# Patient Record
Sex: Male | Born: 1965 | Race: White | Hispanic: No | Marital: Single
Health system: Southern US, Community
[De-identification: ages and names within clinical notes are randomized; demographics above are authoritative.]

---

## 2005-01-05 ENCOUNTER — Other Ambulatory Visit: Payer: Self-pay

## 2005-01-05 ENCOUNTER — Emergency Department: Payer: Self-pay | Admitting: Emergency Medicine

## 2009-07-07 ENCOUNTER — Emergency Department: Payer: Self-pay | Admitting: Emergency Medicine

## 2009-07-14 ENCOUNTER — Emergency Department (HOSPITAL_COMMUNITY): Admission: EM | Admit: 2009-07-14 | Discharge: 2009-07-14 | Payer: Self-pay | Admitting: Emergency Medicine

## 2009-09-03 ENCOUNTER — Emergency Department: Payer: Self-pay | Admitting: Internal Medicine

## 2010-07-01 ENCOUNTER — Emergency Department: Payer: Self-pay | Admitting: Emergency Medicine

## 2010-07-02 ENCOUNTER — Ambulatory Visit: Payer: Self-pay | Admitting: Unknown Physician Specialty

## 2010-07-03 ENCOUNTER — Ambulatory Visit: Payer: Self-pay | Admitting: Unknown Physician Specialty

## 2010-07-13 ENCOUNTER — Emergency Department: Payer: Self-pay | Admitting: Emergency Medicine

## 2010-07-16 LAB — DIFFERENTIAL
Basophils Absolute: 0.1 10*3/uL (ref 0.0–0.1)
Basophils Relative: 1 % (ref 0–1)
Monocytes Relative: 5 % (ref 3–12)
Neutro Abs: 5.8 10*3/uL (ref 1.7–7.7)

## 2010-07-16 LAB — COMPREHENSIVE METABOLIC PANEL
AST: 46 U/L — ABNORMAL HIGH (ref 0–37)
Albumin: 4.1 g/dL (ref 3.5–5.2)
BUN: 10 mg/dL (ref 6–23)
CO2: 27 mEq/L (ref 19–32)
Calcium: 8.9 mg/dL (ref 8.4–10.5)
Creatinine, Ser: 0.72 mg/dL (ref 0.4–1.5)
GFR calc non Af Amer: >60 mL/min (ref >60)
Glucose, Bld: 93 mg/dL (ref 70–99)
Sodium: 141 mEq/L (ref 135–145)
Total Protein: 6.2 g/dL (ref 6.0–8.3)

## 2010-07-16 LAB — CBC
HCT: 40.4 % (ref 39.0–52.0)
Hemoglobin: 13.8 g/dL (ref 13.0–17.0)
MCV: 103.8 fL — ABNORMAL HIGH (ref 78.0–100.0)
RBC: 3.89 MIL/uL — ABNORMAL LOW (ref 4.22–5.81)
RDW: 12.4 % (ref 11.5–15.5)

## 2010-10-02 ENCOUNTER — Ambulatory Visit: Payer: Self-pay | Admitting: Specialist

## 2010-10-16 ENCOUNTER — Other Ambulatory Visit: Payer: Self-pay | Admitting: Specialist

## 2010-12-11 ENCOUNTER — Ambulatory Visit: Payer: Self-pay | Admitting: Unknown Physician Specialty

## 2010-12-11 ENCOUNTER — Emergency Department: Payer: Self-pay | Admitting: Emergency Medicine

## 2010-12-11 ENCOUNTER — Emergency Department: Payer: Self-pay | Admitting: *Deleted

## 2010-12-22 ENCOUNTER — Ambulatory Visit: Payer: Self-pay | Admitting: Unknown Physician Specialty

## 2011-03-29 IMAGING — CR DG SHOULDER 3+V*L*
1 series · 3 of 3 positions shown · non-contrast
Comparison: none

REASON FOR EXAM: s/p fall, pain and swelling, pt in [place]
COMMENTS:

PROCEDURE:     DXR - DXR SHOULDER LEFT COMPLETE  - July 01, 2010  [DATE]
RESULT:     The patient has sustained a comminuted fracture of the neck of
the left humerus. The bony glenoid appears intact. The AC joint also appears
intact.

[Series 1: view not recorded · 0.17mm/px · 3 of 3 slices shown]
[im 1/3]
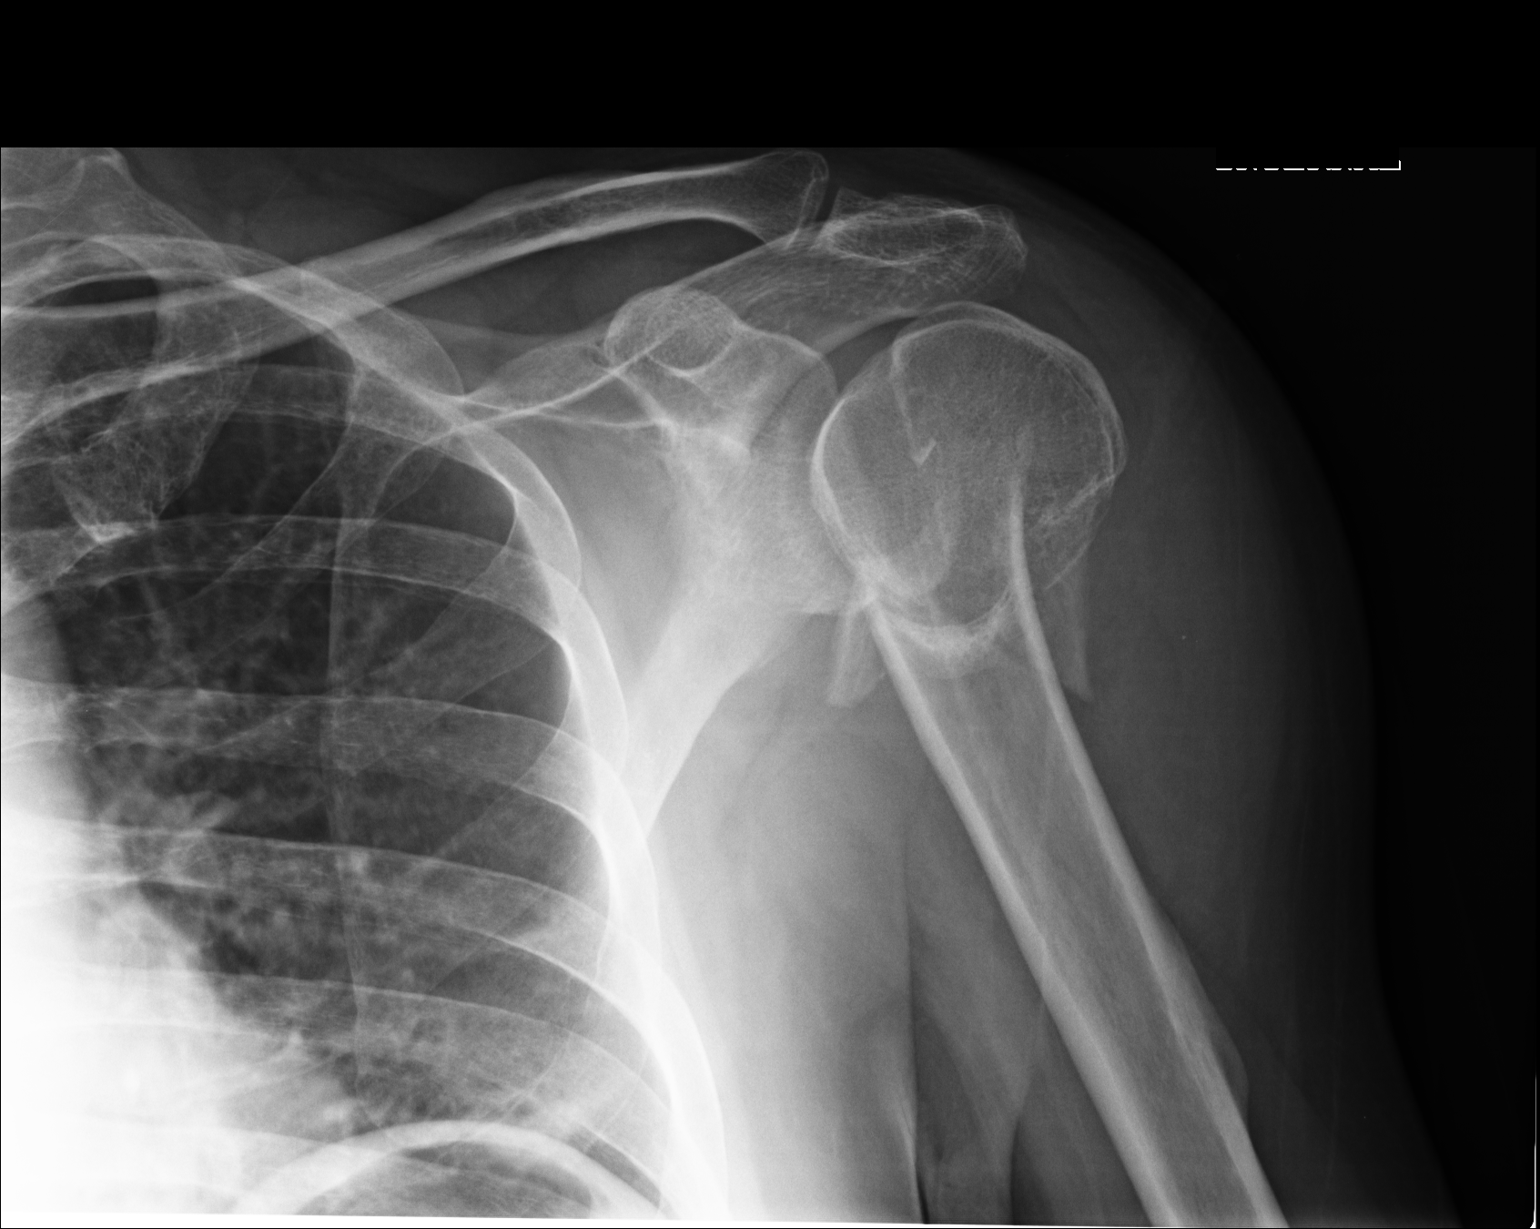
[im 2/3]
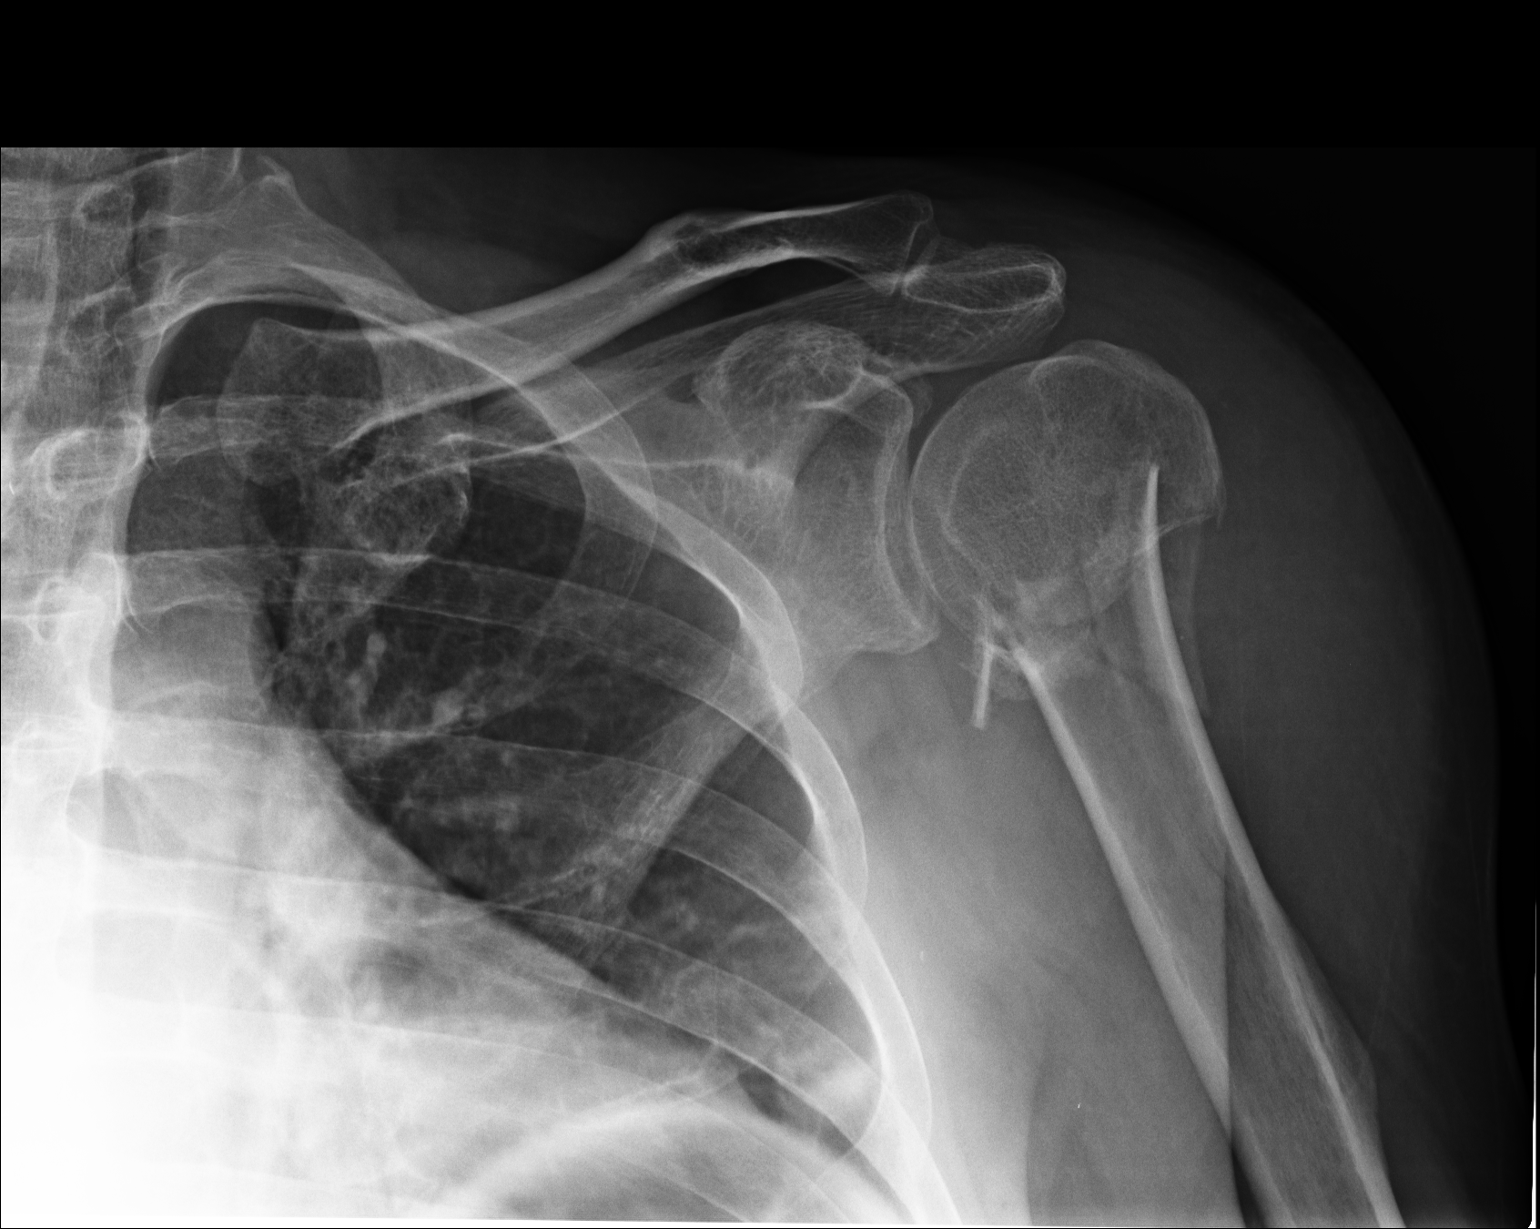
[im 3/3]
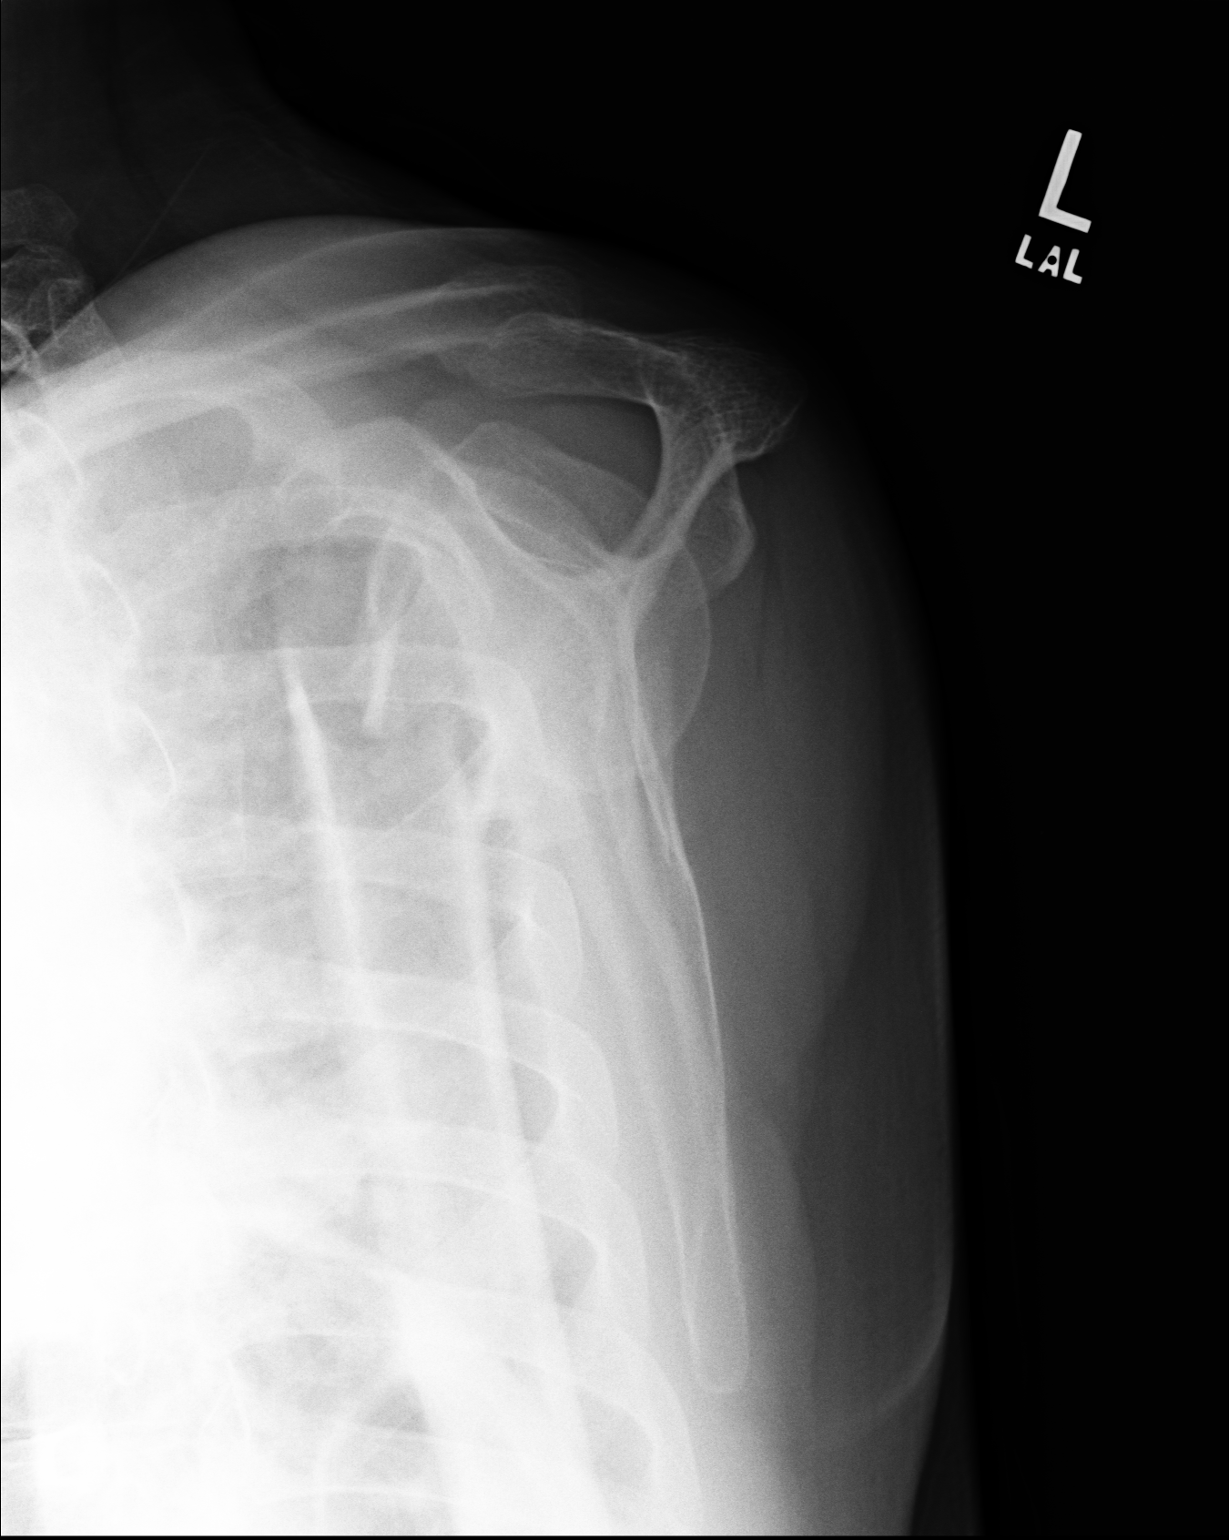

[3 of 3 positions shown; findings below may reference images not displayed]

IMPRESSION: The patient has sustained a comminuted impacted fracture of
the humeral neck on the left.

## 2011-06-30 IMAGING — US US ABDOMEN LIMITED SLG ORGAN/ASCITES
1 series · 17 of 25 positions shown · non-contrast
Comparison: none

REASON FOR EXAM: RUQ Pain Gallstones
COMMENTS:

[Series 1: us abdomen limited slg organ/ascites · 17 of 81 slices shown]
[im 1/81]
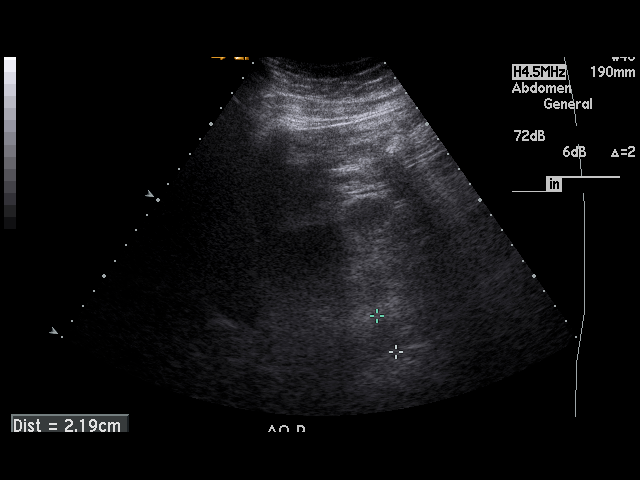
[im 7/81]
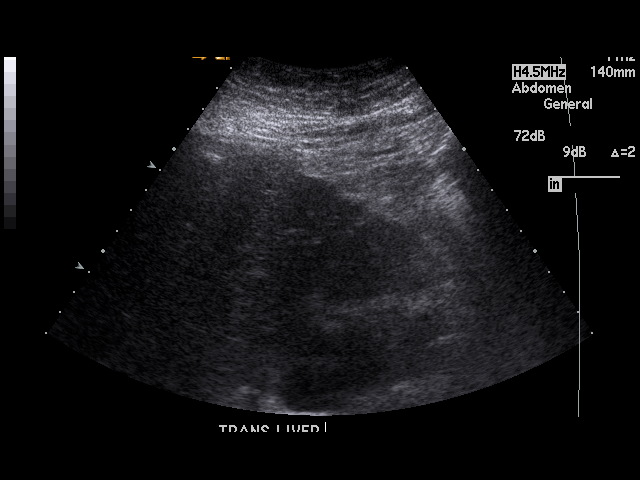
[im 11/81]
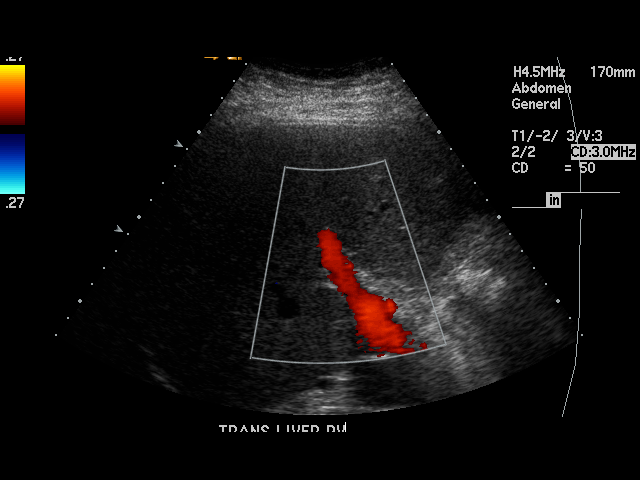
[im 17/81]
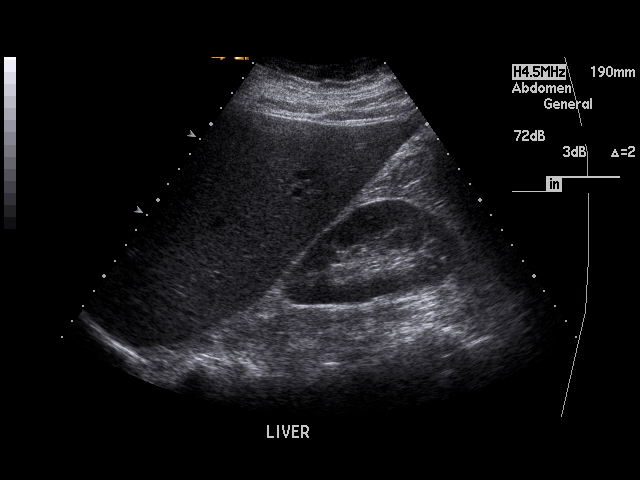
[im 21/81]
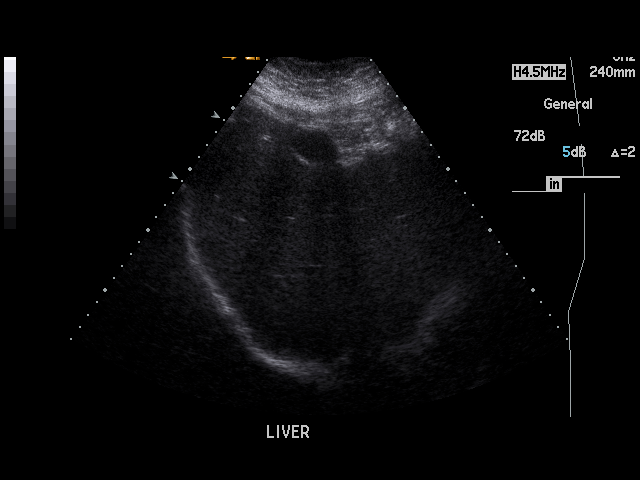
[im 27/81]
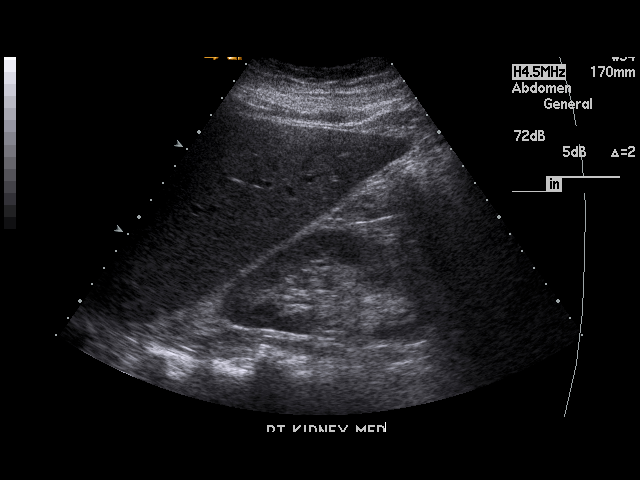
[im 31/81]
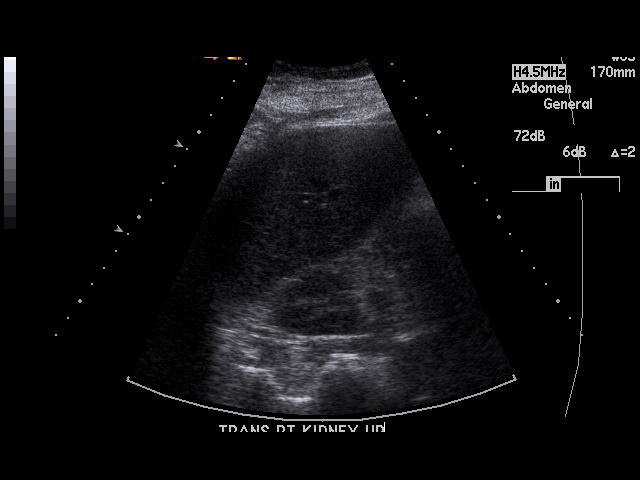
[im 37/81]
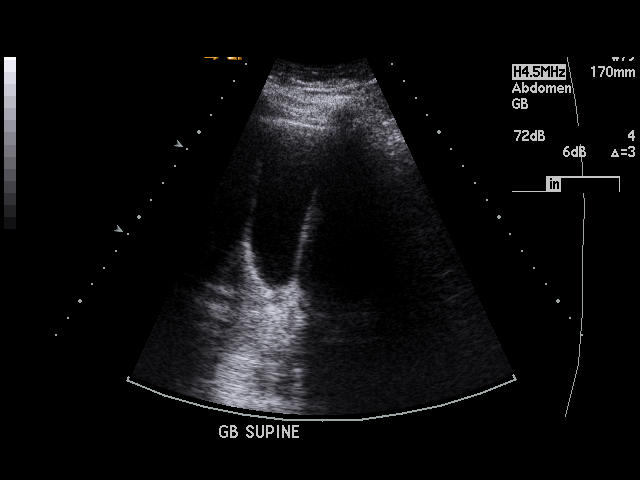
[im 41/81]
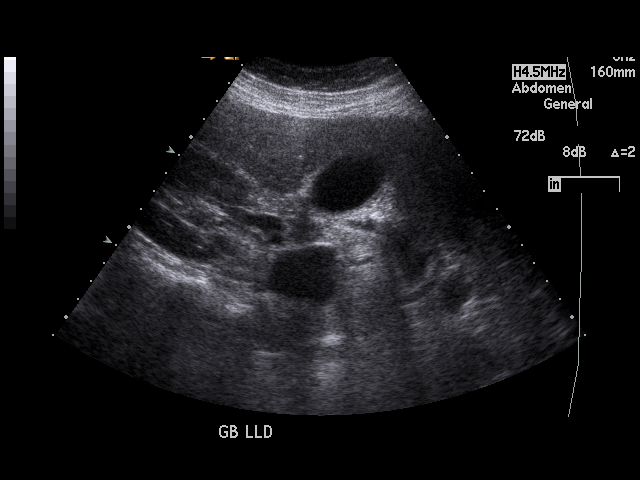
[im 44/81]
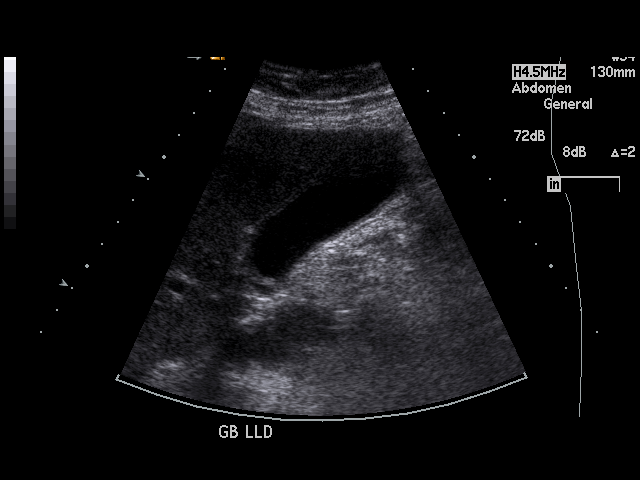
[im 51/81]
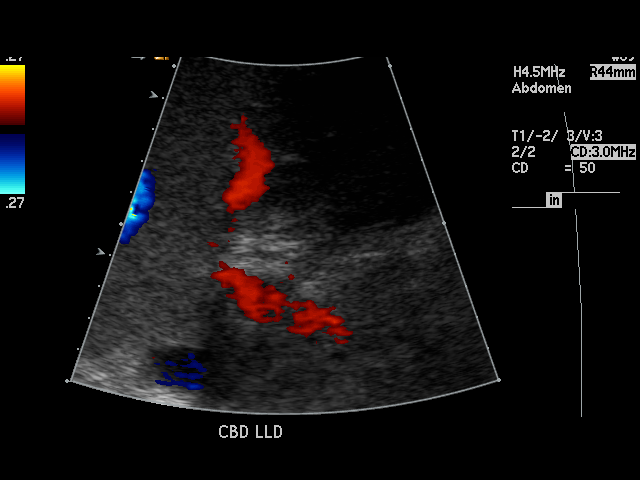
[im 54/81]
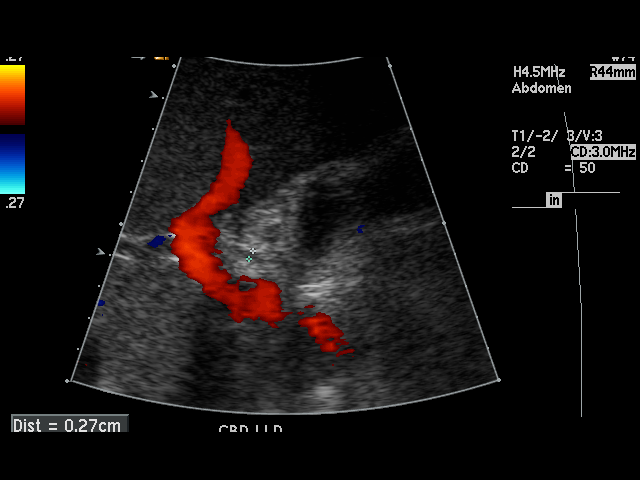
[im 61/81]
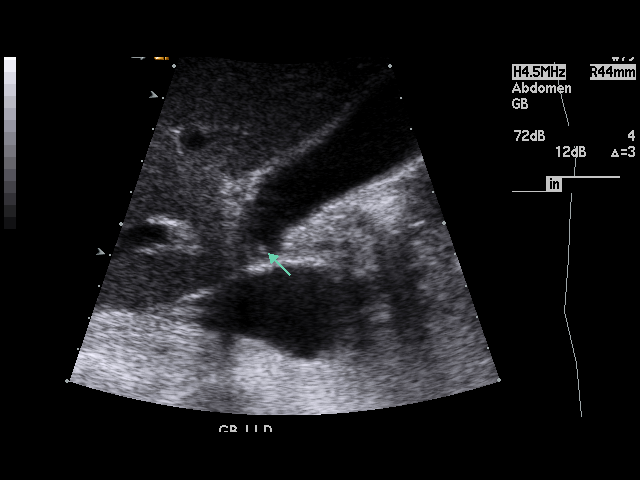
[im 64/81]
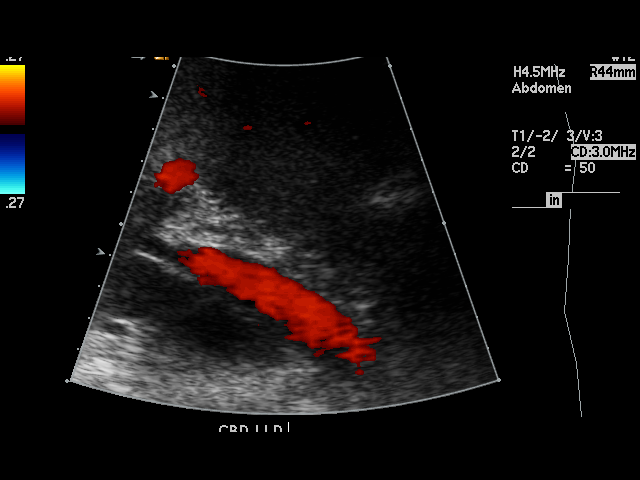
[im 71/81]
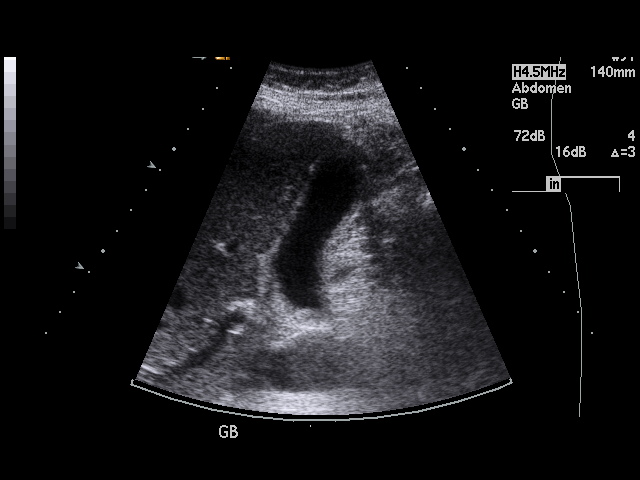
[im 74/81]
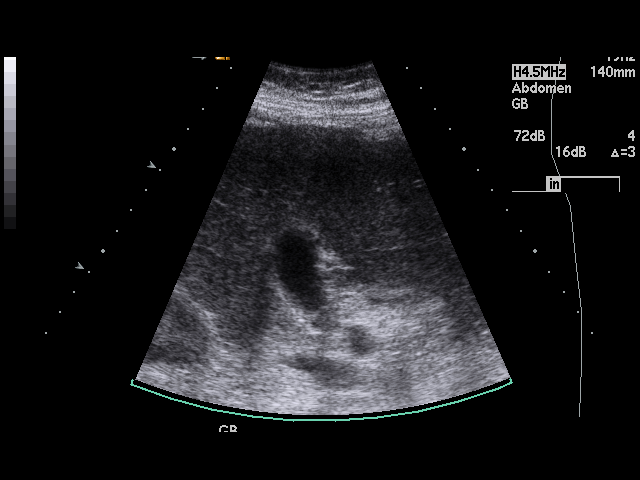
[im 81/81]
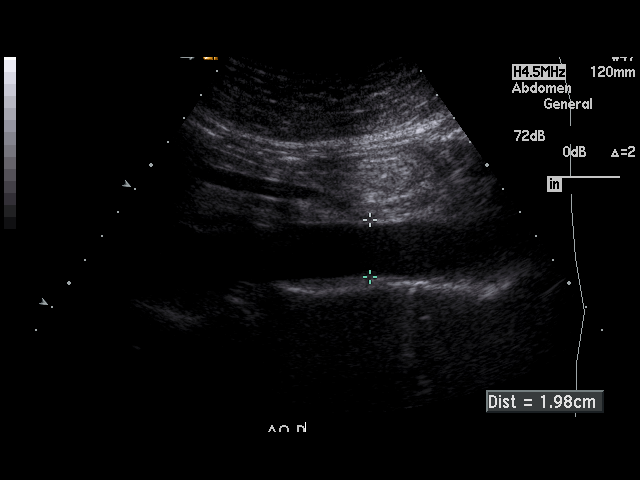

[17 of 25 positions shown; findings below may reference images not displayed]

PROCEDURE:     ALIENDRE - ALIENDRE ABDOMEN LTD 1 ORGAN OR QUAD  - October 02, 2010  [DATE]

RESULT:     Ultrasound of the abdomen is performed in the standard fashion.
The visualized aorta and inferior vena cava appear unremarkable. The
pancreas is poorly seen because of overlying bowel gas. The portal venous
flow appears normal. The liver echotexture is normal. Right kidney length is
13.19 cm. The gallbladder wall thickness appears normal and measures 2.7 mm.
The common bile duct diameter is 2.8 mm. No sonographic Murphy's sign is
evident. There is some minimal echogenicity in the region of the gallbladder
neck which did not move with changes in patient position. This may represent
a small stone. Gallbladder sludge appears to be present. The gallbladder is
not significantly distended. There are no images of the left kidney.
IMPRESSION: 1.     Sludge and possible small stone in the gallbladder neck which does
not move with changes in patient position. Cholelithiasis is a
consideration. No definite sonographic evidence of acute cholecystitis is
demonstrated. A previous limited abdominal ultrasound on 09/03/2009 was
positive for cholelithiasis.
2.     There is limited visualization of the pancreas and aorta.

## 2013-04-12 ENCOUNTER — Emergency Department: Payer: Self-pay | Admitting: Emergency Medicine

## 2013-04-12 LAB — COMPREHENSIVE METABOLIC PANEL
Alkaline Phosphatase: 157 U/L — ABNORMAL HIGH
Anion Gap: 12 (ref 7–16)
BUN: 9 mg/dL (ref 7–18)
Bilirubin,Total: 0.4 mg/dL (ref 0.2–1.0)
Co2: 22 mmol/L (ref 21–32)
Creatinine: 0.85 mg/dL (ref 0.60–1.30)
EGFR (Non-African Amer.): >60
Osmolality: 273 (ref 275–301)
SGOT(AST): 32 U/L (ref 15–37)
SGPT (ALT): 35 U/L (ref 12–78)
Total Protein: 8 g/dL (ref 6.4–8.2)

## 2013-04-12 LAB — DRUG SCREEN, URINE
Barbiturates, Ur Screen: NEGATIVE (ref ?–200)
Benzodiazepine, Ur Scrn: NEGATIVE (ref ?–200)
Cannabinoid 50 Ng, Ur ~~LOC~~: NEGATIVE (ref ?–50)
Cocaine Metabolite,Ur ~~LOC~~: NEGATIVE (ref ?–300)
MDMA (Ecstasy)Ur Screen: POSITIVE (ref ?–500)
Methadone, Ur Screen: NEGATIVE (ref ?–300)
Opiate, Ur Screen: NEGATIVE (ref ?–300)

## 2013-04-12 LAB — TSH: Thyroid Stimulating Horm: 1.5 u[IU]/mL

## 2013-04-12 LAB — CBC
HCT: 43.9 % (ref 40.0–52.0)
HGB: 14.5 g/dL (ref 13.0–18.0)
MCH: 29.6 pg (ref 26.0–34.0)
MCHC: 33 g/dL (ref 32.0–36.0)
MCV: 90 fL (ref 80–100)
RBC: 4.9 10*6/uL (ref 4.40–5.90)

## 2013-11-18 ENCOUNTER — Emergency Department: Payer: Self-pay

## 2013-11-18 LAB — BASIC METABOLIC PANEL
Anion Gap: 7 (ref 7–16)
BUN: 14 mg/dL (ref 7–18)
CHLORIDE: 111 mmol/L — AB (ref 98–107)
CREATININE: 0.99 mg/dL (ref 0.60–1.30)
Calcium, Total: 8.3 mg/dL — ABNORMAL LOW (ref 8.5–10.1)
Co2: 24 mmol/L (ref 21–32)
EGFR (African American): >60
EGFR (Non-African Amer.): >60
Glucose: 117 mg/dL — ABNORMAL HIGH (ref 65–99)
OSMOLALITY: 285 (ref 275–301)
POTASSIUM: 3.6 mmol/L (ref 3.5–5.1)
SODIUM: 142 mmol/L (ref 136–145)

## 2013-11-18 LAB — PROTIME-INR
INR: 1
Prothrombin Time: 13.5 secs (ref 11.5–14.7)

## 2013-11-18 LAB — CBC
HCT: 40.6 % (ref 40.0–52.0)
HGB: 13.1 g/dL (ref 13.0–18.0)
MCH: 30.4 pg (ref 26.0–34.0)
MCHC: 32.2 g/dL (ref 32.0–36.0)
MCV: 94 fL (ref 80–100)
PLATELETS: 223 10*3/uL (ref 150–440)
RBC: 4.31 10*6/uL — ABNORMAL LOW (ref 4.40–5.90)
RDW: 13.9 % (ref 11.5–14.5)
WBC: 4.9 10*3/uL (ref 3.8–10.6)

## 2013-11-18 LAB — APTT: ACTIVATED PTT: 24.7 s (ref 23.6–35.9)

## 2014-08-12 NOTE — Consult Note (Signed)
PATIENT NAME:  Mike Khan, Mike Khan MR#:  [id number] DATE OF BIRTH:  06/18/1965  DATE OF CONSULTATION:  04/13/2013  REFERRING PHYSICIAN:  Eartha Inchory R. York CeriseForbach, MD CONSULTING PHYSICIAN:  Ardeen FillersUzma S. Garnetta BuddyFaheem, MD  REASON FOR CONSULTATION: "If I don't get treatment, she will divorced me."   HISTORY OF PRESENT ILLNESS: The patient is a 49 year old married Caucasian male who presented again to the ED 2 times in 1 day after he left AMA and refused services earlier. He reported that he came to the ED. He stated that he could not get a ride back home and has been having some issues with his wife. The patient reported that he relapsed and started drinking alcohol again. Currently, he has been consuming approximately eighteen 12-ounce beers per day. The patient reported that his wife is upset as hell with him as he has been consuming alcohol on alternate days. He reported that his last drink was on Monday. Reported that he consumed alcohol on Wednesday, Friday and Monday. He did not drink on Thursday as his wife was upset. He reported history of some withdrawal symptoms, including shakes, feeling sweaty and having diarrhea and headaches. The patient reported that he does not have any history of seizures. He stated that he was in the detox program in [place] in July for 3 weeks and then moved on to [place] for 4-1/2 weeks. He completed a 7-1/2-week rehab and was sober for 130 days. 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 He reported that he never filled the prescription for Suboxone, and when he visited his physician, they got upset with him, why he is not taking Suboxone. He reported that his wife does not want him taking Suboxone and wants him to bear the pain. The patient reported that he feels a lot of pain, and it hurts like hell. Reported that he has been prescribed psychotropic medications, including Cymbalta, BuSpar, lamotrigine and doxepin by  [facility]. He started using alcohol to ease his pain. He stated that he is having some marital stress related to the same. He currently denied having any withdrawal symptoms. He stated that he will be able to go back home as his wife is in [place] visiting family members, and then they are planning to go to [place]. He currently denied having any suicidal or homicidal ideations or plans. He denied having any perceptual disturbances.   PAST PSYCHIATRIC HISTORY: The patient reported that he attempted suicide 5 to 6 years ago and was [facility]. He reported that he has never attempted suicide at any other times. He reported that he follows up at the [facility], but he can [facility] as well.   FAMILY HISTORY: The patient reported that his father is alcoholic. His 2 daughters, ages 4927 and 6821, are bipolar and taking medications for the same.    SOCIAL HISTORY: The patient is currently married for 26 years and has 2 daughters. He reported that he is currently having a rocky relationship with his wife related to his alcohol use. The patient stated that he is currently compliant with his medications.   CURRENT MEDICATIONS: Wellbutrin SR 1 tablet 3 times a day, lamotrigine 100 mg daily, Cymbalta 60 mg daily.   ALLERGIES: CLONAZEPAM.   CURRENT MEDICAL ISSUES: Gastric bypass, osteoarthritis, restless leg syndrome, PTSD, radicular neuropathy, testicular pain, prostatitis, left shoulder surgery, liposuction, neck surgery in 2011.  REVIEW OF SYSTEMS:  CONSTITUTIONAL: Denies any fever or chills. No weight changes.  EYES: No double or blurred vision.  RESPIRATORY: No shortness of breath or cough.  CARDIOVASCULAR: No chest pain or orthopnea.  GASTROINTESTINAL: No abdominal pain, nausea, vomiting, diarrhea.  GENITOURINARY: No incontinence or frequency.  ENDOCRINE: No heat or cold intolerance.  LYMPHATIC: No anemia or easy  bruising.  INTEGUMENTARY: No acne or rash.  MUSCULOSKELETAL: No muscle or joint pain.   MENTAL STATUS EXAMINATION: The patient is a moderately built male who appears his stated age. He was calm and cooperative. He maintained fair eye contact. His speech was normal in tone and volume. He appeared somewhat anxious, but reported that he will be able to go back home. He denied having any suicidal ideations or plans. He denied having any withdrawal symptoms at this time.   ANCILLARY DATA:  VITAL SIGNS: Temperature 97.8, pulse 101, respirations 18, blood pressure 139/68.  LABORATORY DATA: Glucose 103, BUN 9, creatinine 0.85, sodium 137, potassium 3.8, chloride 103, bicarbonate 22, anion gap 12, osmolality 273, calcium 85. Blood alcohol level 142. Protein 8.0, albumin 4.5, bilirubin 0.4, alkaline phosphatase 157, AST 32, ALT 35. TSH 1.5. UDS positive for tricyclic antidepressants and MDMA. WBC 9.6, RBC 4.9, hemoglobin 14.5, hematocrit 43.9, platelet count 317, MCV 90, RDW 14.3.   DIAGNOSTIC IMPRESSION:  AXIS I:  1. Alcohol dependence.  2. Alcohol-induced mood disorder.  AXIS II: None.  AXIS III: Please review the medical history.   TREATMENT PLAN: Discussed with the patient at length about the medications, treatment, risks, benefits and alternatives. He reported that he will follow up with his [facility] and is not having any withdrawal symptoms at this time. He can be discharged back home and can stay with his family. He denied having any perceptual disturbances. The patient will be discharged safely with his family members at this time.   Thank you for allowing me to participate in the care of this patient.   ____________________________ Ardeen Fillers. Garnetta Buddy, MD usf:lb D: 04/13/2013 13:44:39 ET T: 04/13/2013 13:58:25 ET JOB#: [id number]  cc: Ardeen Fillers. Garnetta Buddy, MD, <Dictator> Rhunette Croft MD ELECTRONICALLY SIGNED 04/13/2013 14:41

## 2014-08-13 NOTE — Consult Note (Signed)
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   Patient was seen by consultant.   Recommend to proceed with surgery or procedure.   Recommend further assessment or treatment.   Discussed with Attending MD.   Comments: 49 year old male fell in his kitchen today injuring the left wrist.  Brought to Emergency Room where exam and X-rays show a comminuted left distal radius fracture and ulnar fracture with volar wounds consistent with bone protrusion through the skin.  In addition, he has a dense ulnar neuropathy with no sensation in this distribution.  The median nerve sensation is normal.  Moderate swelling and ecchymosis is present..    Exam: as above.  Two punctures volarly from bone protrusion.  No sensation in ulnar distribution.   X-rays: as above  Imp: compound left distal radius and ulnar fractures with ulnar neuropathy  Rx:  Have recommended transfer to a tertiary center for treatment of this complex fracture and nerve injury.  The patient himself [facility] and we will attempt to arrange for this.  Electronic Signatures: Valinda HoarMiller, Lourdes Kucharski E (MD)  (Signed 30-Jul-15 20:38)  Authored: Brief Consult Note   Last Updated: 30-Jul-15 20:38 by Valinda HoarMiller, Avyukt Cimo E (MD)

## 2023-09-01 ENCOUNTER — Other Ambulatory Visit: Payer: Self-pay

## 2023-09-01 ENCOUNTER — Encounter (HOSPITAL_BASED_OUTPATIENT_CLINIC_OR_DEPARTMENT_OTHER): Payer: Self-pay

## 2023-09-01 ENCOUNTER — Emergency Department (HOSPITAL_BASED_OUTPATIENT_CLINIC_OR_DEPARTMENT_OTHER): Admitting: Radiology

## 2023-09-01 ENCOUNTER — Emergency Department (HOSPITAL_BASED_OUTPATIENT_CLINIC_OR_DEPARTMENT_OTHER)
Admission: EM | Admit: 2023-09-01 | Discharge: 2023-09-01 | Disposition: A | Discharge status: Home / Self Care | Attending: Emergency Medicine | Admitting: Emergency Medicine

## 2023-09-01 DIAGNOSIS — X509XXA Other and unspecified overexertion or strenuous movements or postures, initial encounter: Secondary | ICD-10-CM | POA: Insufficient documentation

## 2023-09-01 DIAGNOSIS — M25571 Pain in right ankle and joints of right foot: Secondary | ICD-10-CM | POA: Diagnosis present

## 2023-09-01 DIAGNOSIS — S92154A Nondisplaced avulsion fracture (chip fracture) of right talus, initial encounter for closed fracture: Secondary | ICD-10-CM | POA: Insufficient documentation

## 2023-09-01 DIAGNOSIS — Y9301 Activity, walking, marching and hiking: Secondary | ICD-10-CM | POA: Diagnosis not present

## 2023-09-01 NOTE — Discharge Instructions (Signed)
 Wear Ace bandage to foot and ankle to help with swelling.  I did recommend ice over the area.  You can also elevate your foot and ankle above your heart to help with swelling.  For pain control alternate Tylenol  and ibuprofen.  Please wear walking boot until you follow-up with orthopedic doctor.  Crutches and you can weight-bear as tolerated.  Follow-up with orthopedics you have to call to schedule appointment with Dr. Sedonia Dad.

## 2023-09-01 NOTE — ED Triage Notes (Signed)
 Pt c/o R ankle injury yesterday, states he stepped wrong coming down the steps. States he felt it "pop," wants xray to "see if it's broken or sprained." Able to bear weight, steady gait. Also c/o R knee pain r/t same

## 2023-09-01 NOTE — ED Provider Notes (Signed)
 Mike Khan Provider Note   CSN: [id number] Arrival date & time: 09/01/23  1526     History  Chief Complaint  Patient presents with   Ankle Pain   Knee Pain    Mike Khan is a 58 y.o. male.  Without significant past medical history is reporting to emergency room with right ankle injury.  Patient reports that yesterday he was walking down the steps when he had forced dorsiflexion of his ankle against the stair.  He was holding onto the guard rail and did not fall.  He felt a pop in his head right lateral ankle pain since.  He is able to weight-bear but has noticed significant bruising and swelling to his foot and ankle since then.  Also reports right medial knee pain.  This feels stable and pain is very mild. No BT. No history of similar ankle sprain in past.    Ankle Pain Knee Pain      Home Medications Prior to Admission medications   Not on File      Allergies    Patient has no allergy information on record.    Review of Systems   Review of Systems  Musculoskeletal:  Positive for arthralgias.    Physical Exam Updated Vital Signs BP 122/82   Pulse 76   Temp 98.2 F (36.8 C)   Resp 16   SpO2 99%  Physical Exam Vitals and nursing note reviewed.  Constitutional:      General: He is not in acute distress.    Appearance: He is not toxic-appearing.  HENT:     Head: Normocephalic and atraumatic.  Eyes:     General: No scleral icterus.    Conjunctiva/sclera: Conjunctivae normal.  Cardiovascular:     Rate and Rhythm: Normal rate and regular rhythm.     Pulses: Normal pulses.     Heart sounds: Normal heart sounds.  Pulmonary:     Effort: Pulmonary effort is normal. No respiratory distress.     Breath sounds: Normal breath sounds.  Abdominal:     General: Abdomen is flat. Bowel sounds are normal.     Palpations: Abdomen is soft.     Tenderness: There is no abdominal tenderness.  Musculoskeletal:     Right lower  leg: No edema.     Left lower leg: No edema.     Comments: TTP over right anterior lateral ankle. No TTP over lateral or medial malleolus.  No tenderness to palpation over base of fifth metatarsal.  Strong dorsal pedal pulse.  Able to fully dorsiflex and plantarflex ankle.  Does have mild to moderate swelling and bruising to foot.  Skin:    General: Skin is warm and dry.     Findings: No lesion.  Neurological:     General: No focal deficit present.     Mental Status: He is alert and oriented to person, place, and time. Mental status is at baseline.     ED Results / Procedures / Treatments   Labs (all labs ordered are listed, but only abnormal results are displayed) Labs Reviewed - No data to display  EKG None  Radiology DG Knee Complete 4 Views Right Result Date: 09/01/2023 CLINICAL DATA:  Pain after injury EXAM: RIGHT KNEE - COMPLETE 4+ VIEW COMPARISON:  None Available. FINDINGS: No evidence of fracture, dislocation, or joint effusion. The alignment is maintained. Small quadriceps tendon enthesophyte. Soft tissues are unremarkable. IMPRESSION: No fracture or subluxation of the right knee.  Electronically Signed   By: Chadwick Colonel M.D.   On: 09/01/2023 17:19   DG Ankle Complete Right Result Date: 09/01/2023 CLINICAL DATA:  Pain after injury. EXAM: RIGHT ANKLE - COMPLETE 3+ VIEW; RIGHT FOOT COMPLETE - 3+ VIEW COMPARISON:  None Available. FINDINGS: Ankle: Ossific density adjacent to the dorsal talus may represent avulsion fracture of unknown acuity. No other fracture of the ankle. The ankle mortise is preserved. Soft tissue edema is most prominent laterally. Foot: Again seen ossific density adjacent to the dorsal talus. No other fracture of the foot. The alignment is maintained. Moderate plantar calcaneal spur. Small subacromial spur. Mild soft tissue edema. IMPRESSION: 1. Ossific density adjacent to the dorsal talus may represent avulsion fracture of unknown acuity. Recommend correlation  with point tenderness. 2. No other fracture of the ankle or foot. 3. Soft tissue edema. Electronically Signed   By: Chadwick Colonel M.D.   On: 09/01/2023 17:18   DG Foot Complete Right Result Date: 09/01/2023 CLINICAL DATA:  Pain after injury. EXAM: RIGHT ANKLE - COMPLETE 3+ VIEW; RIGHT FOOT COMPLETE - 3+ VIEW COMPARISON:  None Available. FINDINGS: Ankle: Ossific density adjacent to the dorsal talus may represent avulsion fracture of unknown acuity. No other fracture of the ankle. The ankle mortise is preserved. Soft tissue edema is most prominent laterally. Foot: Again seen ossific density adjacent to the dorsal talus. No other fracture of the foot. The alignment is maintained. Moderate plantar calcaneal spur. Small subacromial spur. Mild soft tissue edema. IMPRESSION: 1. Ossific density adjacent to the dorsal talus may represent avulsion fracture of unknown acuity. Recommend correlation with point tenderness. 2. No other fracture of the ankle or foot. 3. Soft tissue edema. Electronically Signed   By: Chadwick Colonel M.D.   On: 09/01/2023 17:18    Procedures Procedures    Medications Ordered in ED Medications - No data to display  ED Course/ Medical Decision Making/ A&P                                 Medical Decision Making Amount and/or Complexity of Data Reviewed Radiology: ordered.   This patient presents to the ED for concern of ankle pain, this involves an extensive number of treatment options, and is a complaint that carries with it a high risk of complications and morbidity.  The differential diagnosis includes DVT, fracture, sprain, achilles rupture    Imaging Studies ordered:  I ordered imaging studies including right knee, right ankle and foot I independently visualized and interpreted imaging which showed avulsion fracture to dorsal talus representing possible avulsion fracture.  This is the area where patient is most point tender will treat as acute fracture, placed in cam  walking boot and have him follow-up with orthopedics. I agree with the radiologist interpretation    Problem List / ED Course / Critical interventions / Medication management  Presenting to emergency room with right ankle pain.  He has normal range of motion and is able to wiggle his toes. This happened yesterday after fall.  He has strong dorsal pedal pulse equal bilaterally.  Does have mild swelling and bruising to this extremity.  He is not on blood thinner.  Symptoms are not consistent with DVT.  He does have area of point tenderness over dorsal aspect talus consistent with acute fracture seen on x-ray.  He has no laxity of knee joint and has no acute fracture on knee x-ray.  Will  place in cam walking boot and use crutches as tolerated.  Use symptom management follow-up with orthopedics.  Overall stable and well-appearing.  Feel he is appropriate for discharge. Placed in ACE bandage, CAM walking boot, crutches.   I have reviewed the patients home medicines and have made adjustments as needed   Plan  F/u w/ PCP in 2-3d to ensure resolution of sx.  Patient was given return precautions. Patient stable for discharge at this time.  Patient educated on sx/dx and verbalized understanding of plan. Return to ER w/ new or worsening sx.          Final Clinical Impression(s) / ED Diagnoses Final diagnoses:  Closed nondisplaced avulsion fracture of right talus, initial encounter    Rx / DC Orders ED Discharge Orders     None         Adina Ahle 09/01/23 1855    Arvilla Birmingham, MD 09/01/23 2311
